# Patient Record
Sex: Male | Born: 1985 | Race: White | Hispanic: No | Marital: Single | Smoking: Former smoker
Health system: Southern US, Community
[De-identification: ages and names within clinical notes are randomized; demographics above are authoritative.]

## PROBLEM LIST (undated history)

## (undated) DIAGNOSIS — F419 Anxiety disorder, unspecified: Secondary | ICD-10-CM

## (undated) HISTORY — DX: Anxiety disorder, unspecified: F41.9

## (undated) HISTORY — PX: OTHER SURGICAL HISTORY: SHX169

---

## 2016-02-18 DIAGNOSIS — Z0000 Encounter for general adult medical examination without abnormal findings: Secondary | ICD-10-CM | POA: Diagnosis not present

## 2016-02-18 DIAGNOSIS — F4010 Social phobia, unspecified: Secondary | ICD-10-CM | POA: Diagnosis not present

## 2016-12-07 DIAGNOSIS — F4010 Social phobia, unspecified: Secondary | ICD-10-CM | POA: Diagnosis not present

## 2016-12-19 DIAGNOSIS — B078 Other viral warts: Secondary | ICD-10-CM | POA: Diagnosis not present

## 2017-02-28 ENCOUNTER — Other Ambulatory Visit: Payer: Self-pay | Admitting: Family Medicine

## 2017-02-28 ENCOUNTER — Ambulatory Visit
Admission: RE | Admit: 2017-02-28 | Discharge: 2017-02-28 | Disposition: A | Discharge status: Home / Self Care | Payer: BLUE CROSS/BLUE SHIELD | Source: Ambulatory Visit | Attending: Family Medicine | Admitting: Family Medicine

## 2017-02-28 DIAGNOSIS — F4010 Social phobia, unspecified: Secondary | ICD-10-CM | POA: Diagnosis not present

## 2017-02-28 DIAGNOSIS — R1031 Right lower quadrant pain: Secondary | ICD-10-CM

## 2017-02-28 IMAGING — CT CT ABD-PELV W/ CM
1 of 2 series · 15 of 32 positions shown, 19 images · IV contrast (APPLIED)
Comparison: None.

CLINICAL DATA: Right lower quadrant pain for 3 days, constipation

EXAM:
CT ABDOMEN AND PELVIS WITH CONTRAST
TECHNIQUE: Multidetector CT imaging of the abdomen and pelvis was performed
using the standard protocol following bolus administration of
intravenous contrast.
CONTRAST:  100mL DNLLCE-ODD IOPAMIDOL (DNLLCE-ODD) INJECTION 61%

[Series 2: abd/pelvis w/cm · axial · 0.75mm/px · z∈[+647,+1072]mm · 15 of 93 slices shown, 19 images]
[im 4/93  soft-tissue]
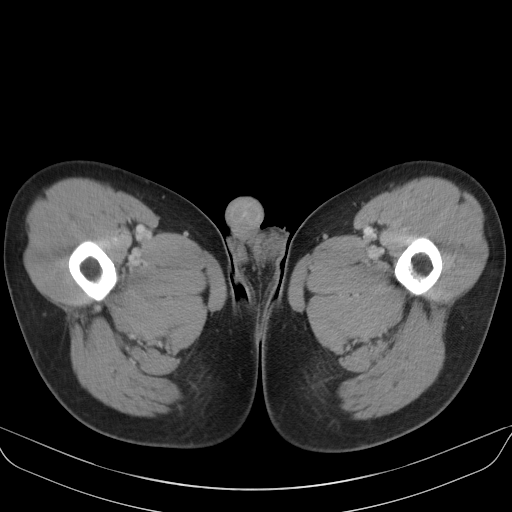
[im 4/93  bone]
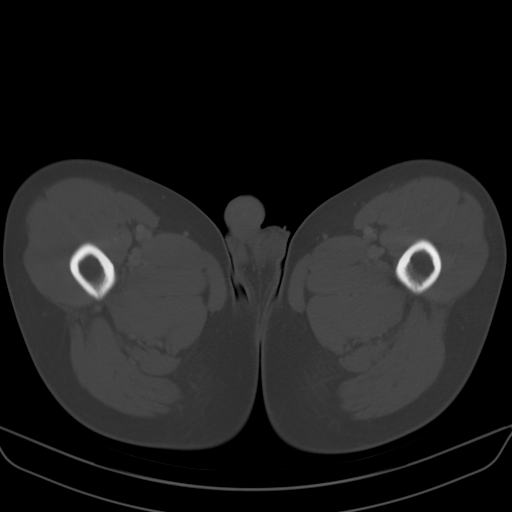
[im 11/93  soft-tissue]
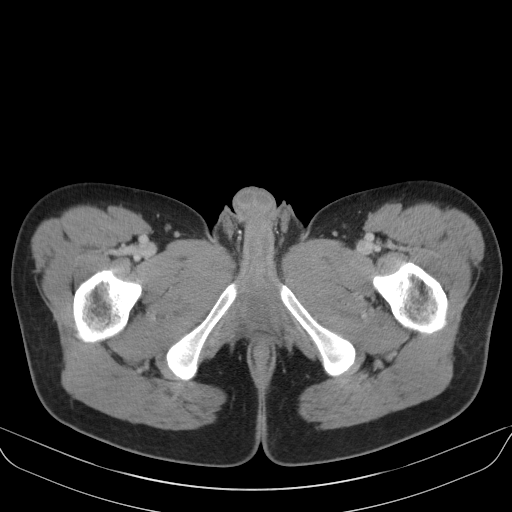
[im 18/93  soft-tissue]
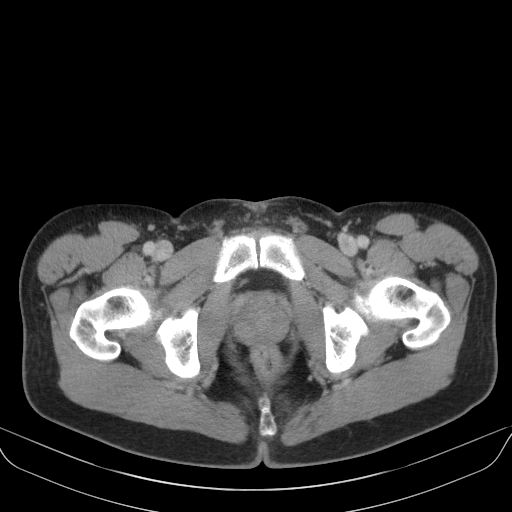
[im 25/93  soft-tissue]
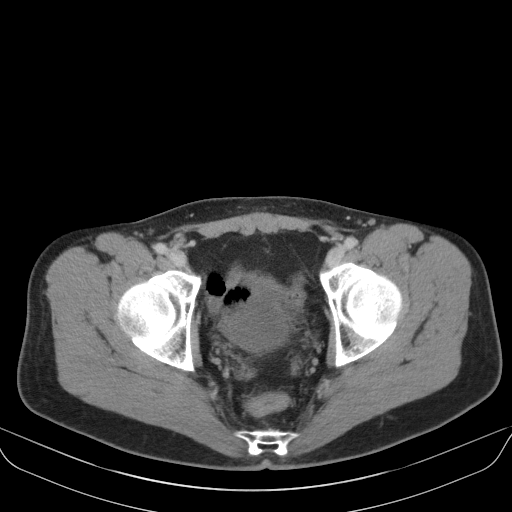
[im 32/93  soft-tissue]
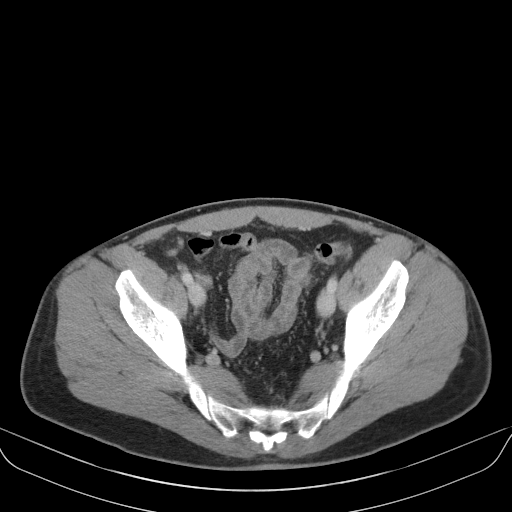
[im 39/93  soft-tissue]
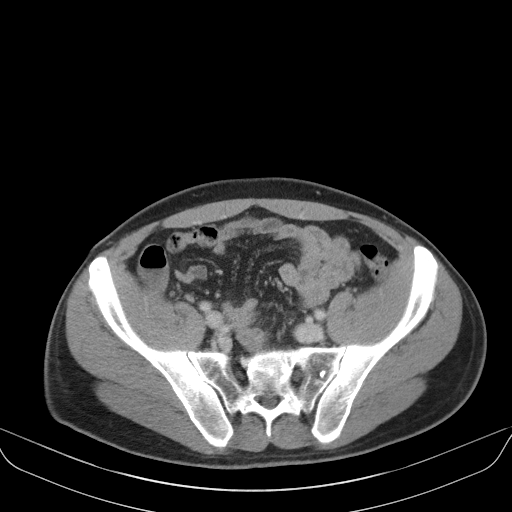
[im 47/93  soft-tissue]
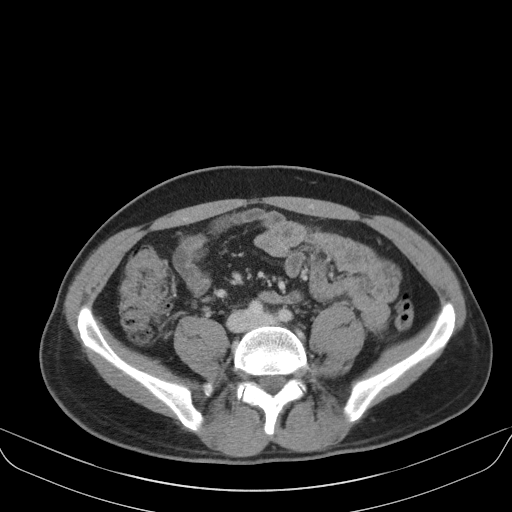
[im 54/93  soft-tissue]
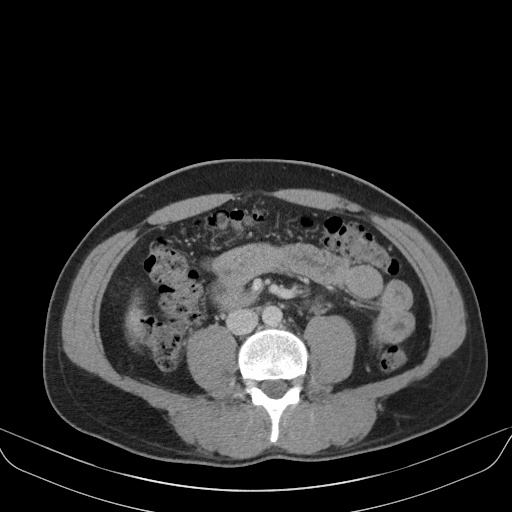
[im 61/93  soft-tissue]
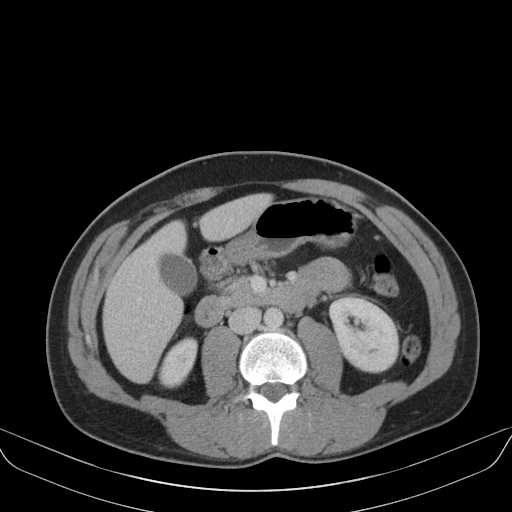
[im 61/93  bone]
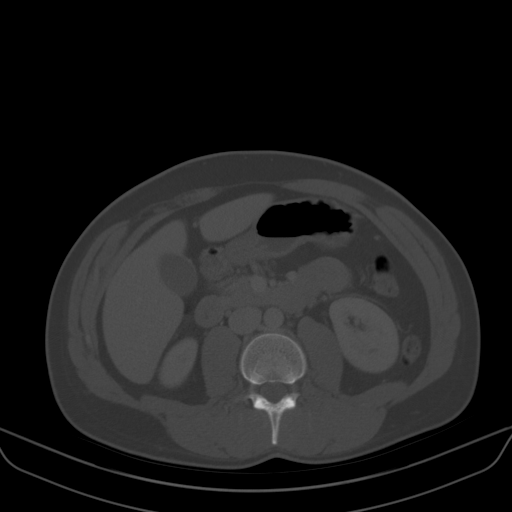
[im 68/93  soft-tissue]
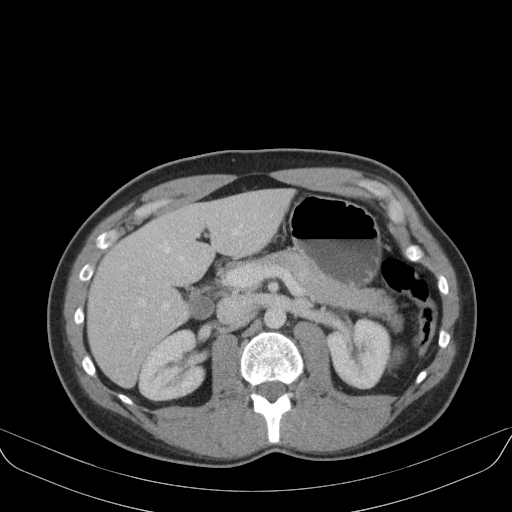
[im 75/93  soft-tissue]
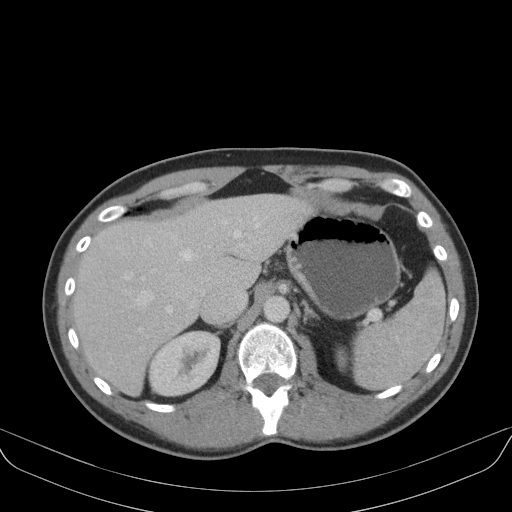
[im 78/93  lung]
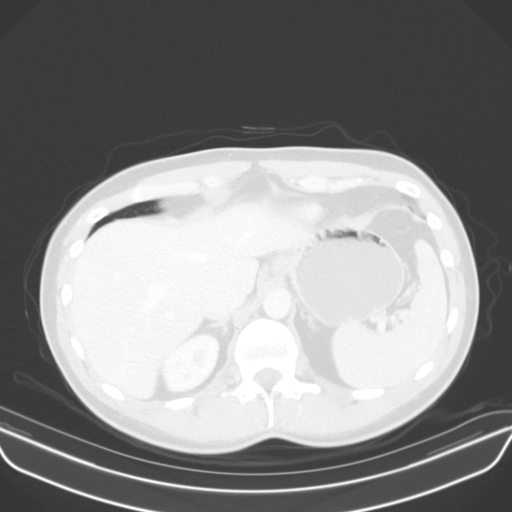
[im 82/93  soft-tissue]
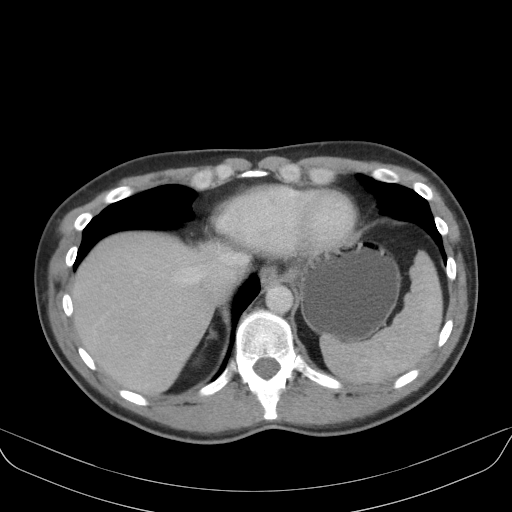
[im 82/93  lung]
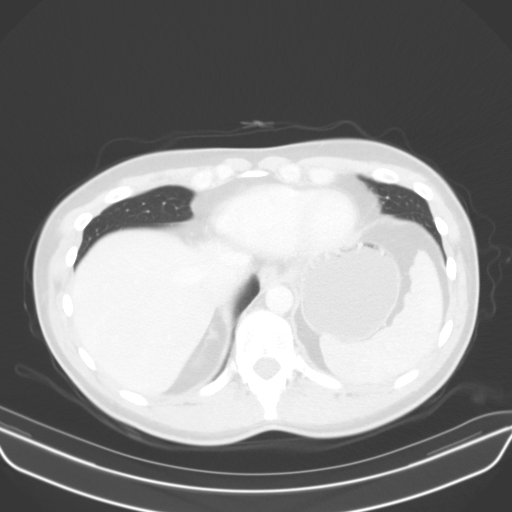
[im 85/93  lung]
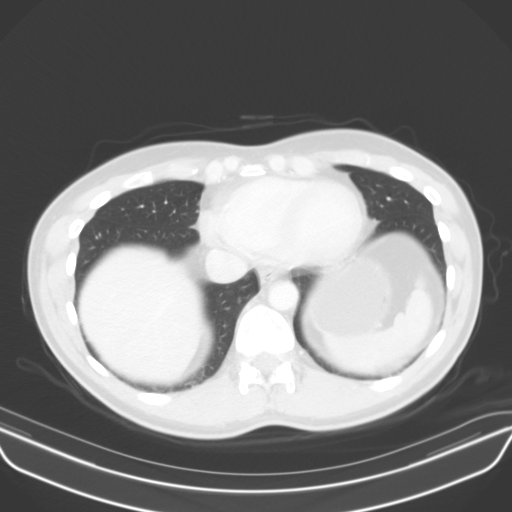
[im 89/93  soft-tissue]
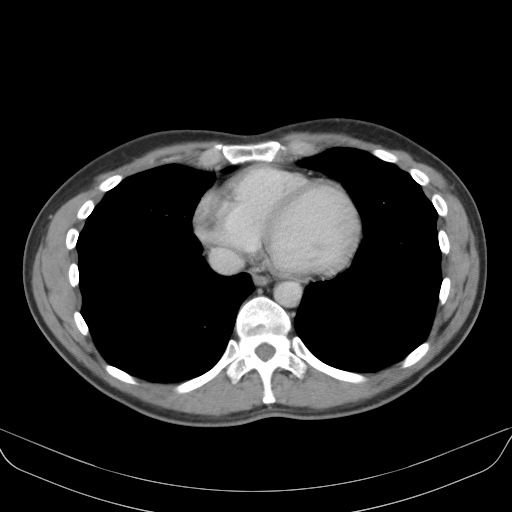
[im 89/93  lung]
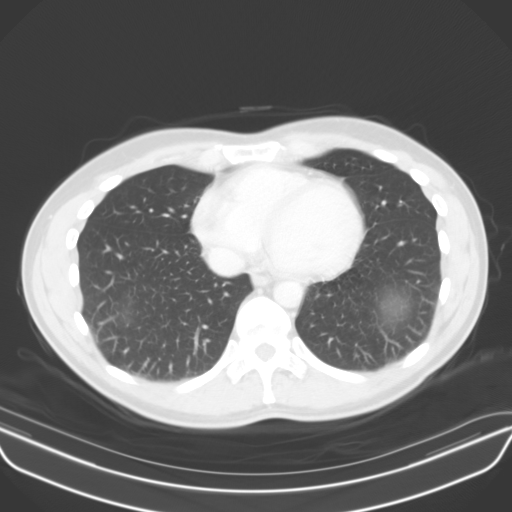

[15 of 32 positions shown; findings below may reference images not displayed]

FINDINGS: Lower chest: The lung bases are clear. The heart is within normal
limits in size.

Hepatobiliary: The liver enhances with no focal abnormality and no
ductal dilatation is seen. No calcified gallstones are noted.

Pancreas: The pancreas is normal in size in the pancreatic duct is
not dilated.

Spleen: The spleen is within normal limits in size.

Adrenals/Urinary Tract: The adrenal glands are symmetrical and
normal. The kidneys enhance with no calculus or mass. No
hydronephrosis is seen. The ureters appear normal in caliber. The
urinary bladder is not well distended but no abnormality is noted.

Stomach/Bowel: The stomach is moderately distended with fluid. No
small bowel distention is seen. The colon is largely decompressed.
There is some feces throughout the colon. The terminal ileum appears
normal, and the appendix extends caudally and anteriorly with no
evidence of inflammation noted.

Vascular/Lymphatic: The abdominal aorta is normal in caliber. No
adenopathy is seen.

Reproductive: The prostate gland is normal in size.

Other: None.

Musculoskeletal: The lumbar vertebrae are in normal alignment with
normal intervertebral disc spaces.
IMPRESSION: 1. No explanation for the patient's right lower quadrant pain is
seen.
2. The appendix and terminal ileum appear unremarkable pre
3. No renal or ureteral calculi are seen.

## 2017-02-28 MED ORDER — IOPAMIDOL (ISOVUE-300) INJECTION 61%
100.0000 mL | Freq: Once | INTRAVENOUS | Status: AC | PRN
  Administered 2017-02-28: 100 mL via INTRAVENOUS

## 2017-04-27 DIAGNOSIS — R152 Fecal urgency: Secondary | ICD-10-CM | POA: Diagnosis not present

## 2017-04-27 DIAGNOSIS — F4010 Social phobia, unspecified: Secondary | ICD-10-CM | POA: Diagnosis not present

## 2017-07-07 ENCOUNTER — Encounter: Payer: Self-pay | Admitting: Gastroenterology

## 2017-07-20 ENCOUNTER — Ambulatory Visit: Payer: BLUE CROSS/BLUE SHIELD | Admitting: Gastroenterology

## 2017-07-20 ENCOUNTER — Encounter: Payer: Self-pay | Admitting: Gastroenterology

## 2017-07-20 ENCOUNTER — Other Ambulatory Visit (INDEPENDENT_AMBULATORY_CARE_PROVIDER_SITE_OTHER): Payer: BLUE CROSS/BLUE SHIELD

## 2017-07-20 ENCOUNTER — Other Ambulatory Visit: Payer: Self-pay

## 2017-07-20 VITALS — BP 108/62 | HR 80 | Ht 71.0 in | Wt 169.2 lb

## 2017-07-20 DIAGNOSIS — R140 Abdominal distension (gaseous): Secondary | ICD-10-CM

## 2017-07-20 DIAGNOSIS — R197 Diarrhea, unspecified: Secondary | ICD-10-CM

## 2017-07-20 LAB — COMPREHENSIVE METABOLIC PANEL
ALBUMIN: 4.3 g/dL (ref 3.5–5.2)
ALK PHOS: 38 U/L — AB (ref 39–117)
ALT: 10 U/L (ref 0–53)
AST: 11 U/L (ref 0–37)
BILIRUBIN TOTAL: 0.5 mg/dL (ref 0.2–1.2)
BUN: 12 mg/dL (ref 6–23)
CALCIUM: 9.5 mg/dL (ref 8.4–10.5)
CO2: 30 mEq/L (ref 19–32)
Chloride: 103 mEq/L (ref 96–112)
Creatinine, Ser: 0.89 mg/dL (ref 0.40–1.50)
GFR: 104.89 mL/min (ref >60.00)
Glucose, Bld: 107 mg/dL — ABNORMAL HIGH (ref 70–99)
Potassium: 4.8 mEq/L (ref 3.5–5.1)
Sodium: 139 mEq/L (ref 135–145)
Total Protein: 7.3 g/dL (ref 6.0–8.3)

## 2017-07-20 LAB — CBC WITH DIFFERENTIAL/PLATELET
BASOS ABS: 0 10*3/uL (ref 0.0–0.1)
Basophils Relative: 0.9 % (ref 0.0–3.0)
EOS ABS: 0.1 10*3/uL (ref 0.0–0.7)
Eosinophils Relative: 2.2 % (ref 0.0–5.0)
HEMATOCRIT: 45.7 % (ref 39.0–52.0)
HEMOGLOBIN: 15.1 g/dL (ref 13.0–17.0)
LYMPHS PCT: 43.2 % (ref 12.0–46.0)
Lymphs Abs: 1.3 10*3/uL (ref 0.7–4.0)
MCHC: 33.1 g/dL (ref 30.0–36.0)
MCV: 80.7 fl (ref 78.0–100.0)
MONOS PCT: 11.8 % (ref 3.0–12.0)
Monocytes Absolute: 0.4 10*3/uL (ref 0.1–1.0)
Neutro Abs: 1.3 10*3/uL — ABNORMAL LOW (ref 1.4–7.7)
Neutrophils Relative %: 41.9 % — ABNORMAL LOW (ref 43.0–77.0)
Platelets: 217 10*3/uL (ref 150.0–400.0)
RBC: 5.66 Mil/uL (ref 4.22–5.81)
RDW: 13.4 % (ref 11.5–15.5)
WBC: 3 10*3/uL — AB (ref 4.0–10.5)

## 2017-07-20 LAB — IGA: IgA: 271 mg/dL (ref 68–378)

## 2017-07-20 LAB — TSH: TSH: 1.49 u[IU]/mL (ref 0.35–4.50)

## 2017-07-20 MED ORDER — LOPERAMIDE HCL 2 MG PO TABS: 2.0000 mg | ORAL_TABLET | Freq: Every day | ORAL | 0 refills | Status: DC

## 2017-07-20 MED ORDER — LOPERAMIDE HCL 2 MG PO TABS: 1.0000 mg | ORAL_TABLET | Freq: Every day | ORAL | 0 refills | Status: AC

## 2017-07-20 NOTE — Patient Instructions (Addendum)
If you are age 33 or older, your body mass index should be between 23-30. Your Body mass index is 23.6 kg/m. If this is out of the aforementioned range listed, please consider follow up with your Primary Care Provider.  If you are age 33 or younger, your body mass index should be between 19-25. Your Body mass index is 23.6 kg/m. If this is out of the aformentioned range listed, please consider follow up with your Primary Care Provider.   Please go to the lab in the basement of our building to have lab work done as you leave today.  We are giving you a Low FOD-MAP diet to follow.  Please purchase the following medications over the counter and take as directed: Imodium - Take 1/2 to 1 tablet every morning.  You can increase to twice a day as needed.   Thank you for entrusting me with your care and for choosing [facility], Dr. Ileene PatrickSteven Armbruster

## 2017-07-20 NOTE — Progress Notes (Signed)
HPI :  32 year old male with a history of anxiety, otherwise healthy, here for new patient visit to discuss changes in his bowel habits.  Over the past year he endorses the urgency to have a bowel movement after each meal. Having 2-3 BMs / day/. Stools are usually soft or loose.  He denies any blood in the stools.  He denies any abdominal pains.  He denies seeing any oil or grease in his stools.  He denies any nocturnal symptoms.  He states he has gained about 15 pounds over the past 2 years, no weight loss.  He denies any significant changes in his diet at all.  He does endorse some bloating and gas which bothers him periodically.  His grandfather had colon cancer, no other family history of colon cancer.  He denies any history of Crohn's disease or colitis or celiac disease within his family.  He previously was on Zoloft for a few years, he stopped this about 8 months ago due to symptoms which did not help at all.  He was then placed on Celexa 20 mg once a day, his symptoms have persisted to the same extent on this regimen.  He is never had a prior colonoscopy.  He did have a CT scan of his abdomen done for an episode of right lower quadrant pain in November 2018, this exam is normal without any concerning pathology.  CT scan abdomen 02/28/2017 - normal exam   Past Medical History:  Diagnosis Date  . Anxiety      Past Surgical History:  Procedure Laterality Date  . none     Family History  Problem Relation Age of Onset  . Prostate cancer Father   . Colon cancer Paternal Grandfather 53  . Esophageal cancer Neg Hx   . Stomach cancer Neg Hx   . Pancreatic cancer Neg Hx   . Liver disease Neg Hx    Social History   Tobacco Use  . Smoking status: Former Games developer  . Smokeless tobacco: Never Used  Substance Use Topics  . Alcohol use: Yes    Comment: 1 drink dialy  . Drug use: Never   Current Outpatient Medications  Medication Sig Dispense Refill  . citalopram (CELEXA) 20 MG tablet  Take 20 mg by mouth daily.     No current facility-administered medications for this visit.    No Known Allergies   Review of Systems: All systems reviewed and negative except where noted in HPI.   Lab Results  Component Value Date   WBC 3.0 (L) 07/20/2017   HGB 15.1 07/20/2017   HCT 45.7 07/20/2017   MCV 80.7 07/20/2017   PLT 217.0 07/20/2017    Lab Results  Component Value Date   CREATININE 0.89 07/20/2017   BUN 12 07/20/2017   NA 139 07/20/2017   K 4.8 07/20/2017   CL 103 07/20/2017   CO2 30 07/20/2017    Lab Results  Component Value Date   ALT 10 07/20/2017   AST 11 07/20/2017   ALKPHOS 38 (L) 07/20/2017   BILITOT 0.5 07/20/2017     Physical Exam: BP 108/62   Pulse 80   Ht 5\' 11"  (1.803 m)   Wt 169 lb 3.2 oz (76.7 kg)   BMI 23.60 kg/m  Constitutional: Pleasant,well-developed, male in no acute distress. HEENT: Normocephalic and atraumatic. Conjunctivae are normal. No scleral icterus. Neck supple.  Cardiovascular: Normal rate, regular rhythm.  Pulmonary/chest: Effort normal and breath sounds normal. No wheezing, rales or rhonchi. Abdominal:  Soft, nondistended, nontender.  There are no masses palpable. No hepatomegaly. Extremities: no edema Lymphadenopathy: No cervical adenopathy noted. Neurological: Alert and oriented to person place and time. Skin: Skin is warm and dry. No rashes noted. Psychiatric: Normal mood and affect. Behavior is normal.   ASSESSMENT AND PLAN: 32 year old male presenting with altered bowel habits / urgency of stools with meals over the past year, as well as bloating.  No other alarm symptoms.  He denies any significant changes in his diet to be related to this.  Trial of coming off Zoloft did not provide any benefit.  We discussed potential causes.  Sent basic labs to ensure, no anemia, etc.  We will also check thyroid and celiac serologies.  In light of some of his bloating recommend a trial of a low FODMAP diet.  Otherwise  recommend trial of Imodium once in the morning and can titrate up or down as needed and see if that helps.  We will relay results of lab work with him when available.  I asked him to touch base with our office in a few weeks and let me know how he is doing on this regimen.   Ileene PatrickSteven Mahlani Berninger, MD [facility] Pager [phone]  CC: Juluis RainierBarnes, Elizabeth, MD

## 2017-07-20 NOTE — Progress Notes (Signed)
Update pt's chart for Imodium dosage

## 2017-07-24 LAB — TISSUE TRANSGLUTAMINASE ABS,IGG,IGA
(TTG) AB, IGG: 2 U/mL
(tTG) Ab, IgA: 1 U/mL

## 2017-07-26 ENCOUNTER — Telehealth: Payer: Self-pay | Admitting: Gastroenterology

## 2017-07-26 NOTE — Telephone Encounter (Signed)
See result note.  

## 2017-07-26 NOTE — Telephone Encounter (Signed)
Patient returning call from Jan.  °

## 2018-08-14 DIAGNOSIS — F4010 Social phobia, unspecified: Secondary | ICD-10-CM | POA: Diagnosis not present

## 2019-12-12 DIAGNOSIS — F4010 Social phobia, unspecified: Secondary | ICD-10-CM | POA: Diagnosis not present

## 2023-03-17 ENCOUNTER — Other Ambulatory Visit: Payer: Self-pay

## 2023-03-17 ENCOUNTER — Encounter: Payer: Self-pay | Admitting: Podiatry

## 2023-03-17 ENCOUNTER — Ambulatory Visit: Payer: 59 | Admitting: Podiatry

## 2023-03-17 ENCOUNTER — Ambulatory Visit: Payer: 59

## 2023-03-17 DIAGNOSIS — M7731 Calcaneal spur, right foot: Secondary | ICD-10-CM

## 2023-03-17 DIAGNOSIS — M79672 Pain in left foot: Secondary | ICD-10-CM

## 2023-03-17 DIAGNOSIS — M79671 Pain in right foot: Secondary | ICD-10-CM

## 2023-03-17 DIAGNOSIS — M722 Plantar fascial fibromatosis: Secondary | ICD-10-CM

## 2023-03-17 MED ORDER — TRIAMCINOLONE ACETONIDE 10 MG/ML IJ SUSP: 2.5000 mg | Freq: Once | INTRAMUSCULAR | Status: AC

## 2023-03-17 MED ORDER — DEXAMETHASONE SODIUM PHOSPHATE 120 MG/30ML IJ SOLN: 4.0000 mg | Freq: Once | INTRAMUSCULAR | Status: AC

## 2023-03-17 MED ORDER — MELOXICAM 15 MG PO TABS: 15.0000 mg | ORAL_TABLET | Freq: Every day | ORAL | 0 refills | Status: DC

## 2023-03-17 NOTE — Patient Instructions (Signed)

## 2023-03-17 NOTE — Progress Notes (Signed)
  Subjective:  Patient ID: Shawn Diaz, male    DOB: 1985-08-27,   MRN: [id number]  Chief Complaint  Patient presents with   Foot Pain    Pt presents for bil foot that started about a year ago pt states the pain is more in the heel and the arch worse when he is on the feet for a long period of time.    37 y.o. male presents for concern as above. Relates he gets most of his pain in the bottom of the heel and arch. More activity hurt worse but does get pain first steps in the morning. He has tried stretching and a night splint without much relief. Has tried some aleve as well.  . Denies any other pedal complaints. Denies n/v/f/c.   Past Medical History:  Diagnosis Date   Anxiety     Objective:  Physical Exam: Vascular: DP/PT pulses 2/4 bilateral. CFT <3 seconds. Normal hair growth on digits. No edema.  Skin. No lacerations or abrasions bilateral feet.  Musculoskeletal: MMT 5/5 bilateral lower extremities in DF, PF, Inversion and Eversion. Deceased ROM in DF of ankle joint. Tender to the medial calcaneal tubercle bilaterally . No pain with achilles, PT or arch. No pain with calcaneal squeeze.  Neurological: Sensation intact to light touch.   Assessment:   1. Plantar fasciitis, bilateral      Plan:  Patient was evaluated and treated and all questions answered. Discussed plantar fasciitis with patient.  X-rays reviewed and discussed with patient. No acute fractures or dislocations noted. Mild spurring noted at inferior calcaneus.  Discussed treatment options including, ice, NSAIDS, supportive shoes, bracing, and stretching. Stretching exercises provided to be done on a daily basis.   Prescription for meloxicam provided and sent to pharmacy. Patient requesting injection today. Procedure note below.   PF brace dispensed bilatearl.  Follow-up 6 weeks or sooner if any problems arise. In the meantime, encouraged to call the office with any questions, concerns, change in symptoms.    Procedure:  Discussed etiology, pathology, conservative vs. surgical therapies. At this time a plantar fascial injection was recommended.  The patient agreed and a sterile skin prep was applied.  An injection consisting of  1cc dexamethasone 0.5 cc kenalog and 1cc marcaine mixture was infiltrated at the point of maximal tenderness on the bilateral Heel.  Bandaid applied. The patient tolerated this well and was given instructions for aftercare.    Louann Sjogren, DPM

## 2023-04-12 ENCOUNTER — Other Ambulatory Visit: Payer: Self-pay | Admitting: Podiatry

## 2023-12-19 ENCOUNTER — Ambulatory Visit: Admitting: Podiatry

## 2023-12-20 ENCOUNTER — Ambulatory Visit: Admitting: Podiatry

## 2023-12-20 ENCOUNTER — Encounter: Payer: Self-pay | Admitting: Podiatry

## 2023-12-20 DIAGNOSIS — M722 Plantar fascial fibromatosis: Secondary | ICD-10-CM | POA: Diagnosis not present

## 2023-12-20 MED ORDER — MELOXICAM 15 MG PO TABS: 15.0000 mg | ORAL_TABLET | Freq: Every day | ORAL | 0 refills | Status: AC

## 2023-12-20 NOTE — Progress Notes (Signed)
  Subjective:  Patient ID: Shawn Diaz, male    DOB: [birth year],   MRN: [id number]  Chief Complaint  Patient presents with   Plantar Fasciitis    The Plantar Fasciitis is not going away.  I been dealing with it for about a year now.  It's both feet.    38 y.o. male presents for concern of continuing plantar fasciitis. Was lost to follow-up for about a year. Relates the pain never went away and has been dealing with it.  Relates still first steps in the morning and now also getting pain under his fifth metatarsal head on the left  . Denies any other pedal complaints. Denies n/v/f/c.   Past Medical History:  Diagnosis Date   Anxiety     Objective:  Physical Exam: Vascular: DP/PT pulses 2/4 bilateral. CFT <3 seconds. Normal hair growth on digits. No edema.  Skin. No lacerations or abrasions bilateral feet.  Musculoskeletal: MMT 5/5 bilateral lower extremities in DF, PF, Inversion and Eversion. Deceased ROM in DF of ankle joint. Tender to the medial calcaneal tubercle bilaterally . No pain with achilles, PT or arch. No pain with calcaneal squeeze. Some pain over the plantar fifth metatarsal on the left.  Neurological: Sensation intact to light touch.   Assessment:   1. Plantar fasciitis, bilateral       Plan:  Patient was evaluated and treated and all questions answered. Discussed plantar fasciitis with patient.  X-rays reviewed and discussed with patient. No acute fractures or dislocations noted. Mild spurring noted at inferior calcaneus.  Discussed treatment options including, ice, NSAIDS, supportive shoes, bracing, and stretching. Stretching exercises provided to be done on a daily basis.   Continue anti-inflammatories  Refill of meloxicam .  Amb ref to PT   Continue brace.  Follow-up 8 weeks or sooner if any problems arise. In the meantime, encouraged to call the office with any questions, concerns, change in symptoms.      Asberry Failing, DPM

## 2024-01-09 ENCOUNTER — Other Ambulatory Visit: Payer: Self-pay

## 2024-01-09 ENCOUNTER — Ambulatory Visit: Attending: Podiatry

## 2024-01-09 DIAGNOSIS — M79671 Pain in right foot: Secondary | ICD-10-CM | POA: Insufficient documentation

## 2024-01-09 DIAGNOSIS — R2689 Other abnormalities of gait and mobility: Secondary | ICD-10-CM | POA: Insufficient documentation

## 2024-01-09 DIAGNOSIS — M79672 Pain in left foot: Secondary | ICD-10-CM | POA: Insufficient documentation

## 2024-01-09 DIAGNOSIS — M722 Plantar fascial fibromatosis: Secondary | ICD-10-CM | POA: Diagnosis not present

## 2024-01-09 NOTE — Therapy (Signed)
 OUTPATIENT PHYSICAL THERAPY LOWER EXTREMITY EVALUATION   Patient Name: Shawn Diaz MRN: [id number] DOB:1986/04/13, 38 y.o., male Today's Date: 01/09/2024  END OF SESSION:  PT End of Session - 01/09/24 0836     Visit Number 1    Number of Visits 9    Date for PT Re-Evaluation 03/05/24    Authorization Type [employer]    PT Start Time 0801    PT Stop Time 0832    PT Time Calculation (min) 31 min    Activity Tolerance Patient tolerated treatment well    Behavior During Therapy [facility] for tasks assessed/performed          Past Medical History:  Diagnosis Date   Anxiety    Past Surgical History:  Procedure Laterality Date   none     There are no active problems to display for this patient.   PCP: Cleotilde Planas, MD  REFERRING PROVIDER: Joya Stabs, DPM   REFERRING DIAG: M72.2 (ICD-10-CM) - Plantar fasciitis, bilateral   THERAPY DIAG:  Pain in left foot  Pain in right foot  Other abnormalities of gait and mobility  Rationale for Evaluation and Treatment: Rehabilitation  ONSET DATE: Chronic  SUBJECTIVE:   SUBJECTIVE STATEMENT: Pt presents to PT with reports of chronic bilateral plantar fascia pain for over one year. Notes pain is worst with first step in morning. Pt notes calf stretching does seem to help. Not limited in any activity but wants to be more comfort with activity.   PERTINENT HISTORY: See PMH  PAIN:  Are you having pain?  Yes: NPRS scale: 3/10 Worst: 7/10 Pain location: bilateral plantar fascia L>R Pain description: sharp, tight Aggravating factors: first step in morning Relieving factors: shoes, arch support  PRECAUTIONS: None  RED FLAGS: None   WEIGHT BEARING RESTRICTIONS: No  FALLS:  Has patient fallen in last 6 months? No  LIVING ENVIRONMENT: Lives with: lives with their family Lives in: House/apartment  OCCUPATION: Works at [employer]   PLOF: Independent  PATIENT GOALS: decrease foot pain with walking, daily activities  NEXT  MD VISIT: 02/21/2024  OBJECTIVE:  Note: Objective measures were completed at Evaluation unless otherwise noted.  DIAGNOSTIC FINDINGS: N/A  PATIENT SURVEYS:  LEFS  Extreme difficulty/unable (0), Quite a bit of difficulty (1), Moderate difficulty (2), Little difficulty (3), No difficulty (4) Survey date:  01/09/24  Any of your usual work, housework or school activities 3  2. Usual hobbies, recreational or sporting activities 3  3. Getting into/out of the bath 3  4. Walking between rooms 3  5. Putting on socks/shoes 3  6. Squatting  3  7. Lifting an object, like a bag of groceries from the floor 3  8. Performing light activities around your home 3  9. Performing heavy activities around your home 3  10. Getting into/out of a car 3  11. Walking 2 blocks 3  12. Walking 1 mile 3  13. Going up/down 10 stairs (1 flight) 3  14. Standing for 1 hour 3  15.  sitting for 1 hour 3  16. Running on even ground 2  17. Running on uneven ground 2  18. Making sharp turns while running fast 2  19. Hopping  3  20. Rolling over in bed 4  Score total:  68/80     COGNITION: Overall cognitive status: Within functional limits for tasks assessed     SENSATION: WFL  POSTURE: No Significant postural limitations  PALPATION: Slight TTP to bilateral gastroc/soleus   LOWER EXTREMITY  ROM:  Active ROM Right eval Left eval  Hip flexion    Hip extension    Hip abduction    Hip adduction    Hip internal rotation    Hip external rotation    Knee flexion    Knee extension    Ankle dorsiflexion 10 12  Ankle plantarflexion    Ankle inversion    Ankle eversion     (Blank rows = not tested)  LOWER EXTREMITY MMT:  MMT Right eval Left eval  Hip flexion    Hip extension    Hip abduction    Hip adduction    Hip internal rotation    Hip external rotation    Knee flexion    Knee extension    Ankle dorsiflexion    Ankle plantarflexion    Ankle inversion    Ankle eversion     (Blank rows =  not tested)  LOWER EXTREMITY SPECIAL TESTS:  DNT  FUNCTIONAL TESTS:  Functional squat - able with hands to floor  GAIT: Distance walked: 28ft Assistive device utilized: None Level of assistance: Complete Independence Comments: slight antalgic gait L   TREATMENT: [facility] Adult PT Treatment:                                                DATE: 01/09/2024 Therapeutic Exercise: Towel scrunch x 60 Ankle inv/ev x 10 RTB Gastroc stretch x 30 L Soleus stretch x 30 L Eccentric lowering x 5 - 4in step  PATIENT EDUCATION:  Education details: eval findings, LEFS, HEP, POC Person educated: Patient Education method: Explanation, Demonstration, and Handouts Education comprehension: verbalized understanding and returned demonstration  HOME EXERCISE PROGRAM: Access Code: [email or web address] URL: [email or web address] Date: 01/09/2024 Prepared by: Alm Kingdom  Exercises - Towel Scrunches  - 2 x daily - 7 x weekly - 3 sets - 1-2 min hold - Ankle Inversion with Resistance  - 2 x daily - 7 x weekly - 3 sets - 10 reps - red band hold - Ankle Eversion with Resistance  - 2 x daily - 7 x weekly - 3 sets - 10 reps - red band hold - Gastroc Stretch on Wall  - 2 x daily - 7 x weekly - 2-3 reps - 30 sec hold - Soleus Stretch on Wall  - 2 x daily - 7 x weekly - 3 sets - 2-3 reps - 30 sec hold - Eccentric Heel Lowering on Step  - 1 x daily - 7 x weekly - 2-3 sets - 10 reps  ASSESSMENT:  CLINICAL IMPRESSION: Patient is a 38 y.o. M who was seen today for physical therapy evaluation and treatment for chronic bilateral plantar fasciitis. Physical findings are consistent with DPM impression as pt demonstrates decrease in soleus length and palpable plantar fascia pain. LEFS shows decrease in functional ability below PLOF. Pt would benefit from skilled PT services working on foot intrinsic strength and calf length in order to decrease pain.   OBJECTIVE IMPAIRMENTS: Abnormal gait, decreased activity  tolerance, decreased mobility, difficulty walking, decreased ROM, decreased strength, and pain  ACTIVITY LIMITATIONS: carrying, lifting, standing, squatting, stairs, transfers, and locomotion level  PARTICIPATION LIMITATIONS: driving, shopping, community activity, occupation, and yard work  PERSONAL FACTORS: Time since onset of injury/illness/exacerbation are also affecting patient's functional outcome.   REHAB POTENTIAL: Excellent  CLINICAL DECISION MAKING: Stable/uncomplicated  EVALUATION COMPLEXITY: Low  GOALS: Goals reviewed with patient? No  SHORT TERM GOALS: Target date: 01/30/2024   Pt will be compliant and knowledgeable with initial HEP for improved comfort and carryover Baseline: initial HEP given  Goal status: INITIAL  2.  Pt will self report bilateral foot pain no greater than 4/10 for improved comfort and functional ability Baseline: 7/10 at worst Goal status: INITIAL   LONG TERM GOALS: Target date: 03/05/2024   Pt will improve LEFS to no less than 78/80 as proxy for functional improvement with home ADLs and higher level community activity Baseline: 68/80 Goal status: INITIAL   2.  Pt will self report bilateral foot pain no greater than 1-2/10 for improved comfort and functional ability Baseline: 7/10 at worst Goal status: INITIAL   3.  Pt will improve bilateral ankle DF to no less than 15 degrees for improved calf length and decreased pain Baseline: see chart  Goal status: INITIAL  4.  Pt will be able to perform walking and desired recreational activities with no increase in foot pain for improved comfort and function Baseline: unable Goal status: INITIAL   PLAN:  PT FREQUENCY: 1x/week  PT DURATION: 8 weeks  PLANNED INTERVENTIONS: 97164- PT Re-evaluation, 97110-Therapeutic exercises, 97530- Therapeutic activity, [id number]- Neuromuscular re-education, 97535- Self Care, [id number]- Manual therapy, [id number]- Gait training, 97016- Vasopneumatic device, [phone] (1-2  muscles), [id number] (3+ muscles)- Dry Needling, Patient/Family education, Cryotherapy, and Moist heat  PLAN FOR NEXT SESSION: assess HEP response, calf stretching/eccentric strengthening, foot intrinsics   Alm JAYSON Kingdom, PT 01/09/2024, 8:38 AM

## 2024-01-22 ENCOUNTER — Ambulatory Visit: Admitting: Physical Therapy

## 2024-01-22 ENCOUNTER — Encounter: Payer: Self-pay | Admitting: Physical Therapy

## 2024-01-22 DIAGNOSIS — R2689 Other abnormalities of gait and mobility: Secondary | ICD-10-CM

## 2024-01-22 DIAGNOSIS — M79671 Pain in right foot: Secondary | ICD-10-CM

## 2024-01-22 DIAGNOSIS — M79672 Pain in left foot: Secondary | ICD-10-CM

## 2024-01-22 NOTE — Therapy (Signed)
 OUTPATIENT PHYSICAL THERAPY LOWER EXTREMITY TREATMENT    Patient Name: Shawn Diaz MRN: [id number] DOB:Apr 01, 1986, 38 y.o., male Today's Date: 01/22/2024  END OF SESSION:  PT End of Session - 01/22/24 0805     Visit Number 2    Number of Visits 9    Date for Recertification  03/05/24    Authorization Type [employer]    PT Start Time 0805    PT Stop Time 0845    PT Time Calculation (min) 40 min          Past Medical History:  Diagnosis Date   Anxiety    Past Surgical History:  Procedure Laterality Date   none     There are no active problems to display for this patient.   PCP: Cleotilde Planas, MD  REFERRING PROVIDER: Joya Stabs, DPM   REFERRING DIAG: M72.2 (ICD-10-CM) - Plantar fasciitis, bilateral   THERAPY DIAG:  Pain in left foot  Pain in right foot  Other abnormalities of gait and mobility  Rationale for Evaluation and Treatment: Rehabilitation  ONSET DATE: Chronic  SUBJECTIVE:   SUBJECTIVE STATEMENT: Pain is 5/10 this morning. I think the exercises are helping.   EVAL: Pt presents to PT with reports of chronic bilateral plantar fascia pain for over one year. Notes pain is worst with first step in morning. Pt notes calf stretching does seem to help. Not limited in any activity but wants to be more comfort with activity.   PERTINENT HISTORY: See PMH  PAIN:  Are you having pain?  Yes: NPRS scale: 5/10 Worst: 7/10 Pain location: bilateral plantar fascia L>R Pain description: sharp, tight Aggravating factors: first step in morning Relieving factors: shoes, arch support  PRECAUTIONS: None  RED FLAGS: None   WEIGHT BEARING RESTRICTIONS: No  FALLS:  Has patient fallen in last 6 months? No  LIVING ENVIRONMENT: Lives with: lives with their family Lives in: House/apartment  OCCUPATION: Works at [employer]   PLOF: Independent  PATIENT GOALS: decrease foot pain with walking, daily activities  NEXT MD VISIT: 02/21/2024  OBJECTIVE:  Note:  Objective measures were completed at Evaluation unless otherwise noted.  DIAGNOSTIC FINDINGS: N/A  PATIENT SURVEYS:  LEFS  Extreme difficulty/unable (0), Quite a bit of difficulty (1), Moderate difficulty (2), Little difficulty (3), No difficulty (4) Survey date:  01/09/24  Any of your usual work, housework or school activities 3  2. Usual hobbies, recreational or sporting activities 3  3. Getting into/out of the bath 3  4. Walking between rooms 3  5. Putting on socks/shoes 3  6. Squatting  3  7. Lifting an object, like a bag of groceries from the floor 3  8. Performing light activities around your home 3  9. Performing heavy activities around your home 3  10. Getting into/out of a car 3  11. Walking 2 blocks 3  12. Walking 1 mile 3  13. Going up/down 10 stairs (1 flight) 3  14. Standing for 1 hour 3  15.  sitting for 1 hour 3  16. Running on even ground 2  17. Running on uneven ground 2  18. Making sharp turns while running fast 2  19. Hopping  3  20. Rolling over in bed 4  Score total:  68/80     COGNITION: Overall cognitive status: Within functional limits for tasks assessed     SENSATION: WFL  POSTURE: No Significant postural limitations  PALPATION: Slight TTP to bilateral gastroc/soleus   LOWER EXTREMITY ROM:  Active ROM  Right eval Left eval  Hip flexion    Hip extension    Hip abduction    Hip adduction    Hip internal rotation    Hip external rotation    Knee flexion    Knee extension    Ankle dorsiflexion 10 12  Ankle plantarflexion    Ankle inversion    Ankle eversion     (Blank rows = not tested)  LOWER EXTREMITY MMT:  MMT Right eval Left eval  Hip flexion    Hip extension    Hip abduction    Hip adduction    Hip internal rotation    Hip external rotation    Knee flexion    Knee extension    Ankle dorsiflexion    Ankle plantarflexion    Ankle inversion    Ankle eversion     (Blank rows = not tested)  LOWER EXTREMITY SPECIAL  TESTS:  DNT  FUNCTIONAL TESTS:  Functional squat - able with hands to floor  GAIT: Distance walked: 69ft Assistive device utilized: None Level of assistance: Complete Independence Comments: slight antalgic gait L   TREATMENT: [facility] Adult PT Treatment:                                                DATE: 01/22/24 Therapeutic Exercise: Rec Bike Level 2 x 5 minutes  Eccentric lowering 10 x 2 each  - 4in step Gastroc stretch slant board Soleus stretch slant board  SLS on oval 60 sec each  Towel scrunch x 60 Ankle inv/ev x 10 RTB Seated toe scrunches on towel  Supine ankle inversion/eversion Red band x 20 each  Long sitting plantar fascia stretch with towel  Manual Therapy: Plantar fascia STW   Self Care: Discussed rolling water bottle under foot    [facility] Adult PT Treatment:                                                DATE: 01/09/2024 Therapeutic Exercise: Towel scrunch x 60 Ankle inv/ev x 10 RTB Gastroc stretch x 30 L Soleus stretch x 30 L Eccentric lowering x 5 - 4in step  PATIENT EDUCATION:  Education details: eval findings, LEFS, HEP, POC Person educated: Patient Education method: Explanation, Demonstration, and Handouts Education comprehension: verbalized understanding and returned demonstration  HOME EXERCISE PROGRAM: Access Code: [email or web address] URL: [email or web address] Date: 01/09/2024 Prepared by: Alm Kingdom  Exercises - Towel Scrunches  - 2 x daily - 7 x weekly - 3 sets - 1-2 min hold - Ankle Inversion with Resistance  - 2 x daily - 7 x weekly - 3 sets - 10 reps - red band hold - Ankle Eversion with Resistance  - 2 x daily - 7 x weekly - 3 sets - 10 reps - red band hold - Gastroc Stretch on Wall  - 2 x daily - 7 x weekly - 2-3 reps - 30 sec hold - Soleus Stretch on Wall  - 2 x daily - 7 x weekly - 3 sets - 2-3 reps - 30 sec hold - Eccentric Heel Lowering on Step  - 1 x daily - 7 x weekly - 2-3 sets - 10 reps  ASSESSMENT:  CLINICAL  IMPRESSION: Pt reports compliance  with HEP and mild improvement. 5/10 pain in plantar heels today. Reviewed HEP, discussed self care strategies and began manual for Plantar fascia pain. No increased pain reported at end of session.    EVAL: Patient is a 39 y.o. M who was seen today for physical therapy evaluation and treatment for chronic bilateral plantar fasciitis. Physical findings are consistent with DPM impression as pt demonstrates decrease in soleus length and palpable plantar fascia pain. LEFS shows decrease in functional ability below PLOF. Pt would benefit from skilled PT services working on foot intrinsic strength and calf length in order to decrease pain.   OBJECTIVE IMPAIRMENTS: Abnormal gait, decreased activity tolerance, decreased mobility, difficulty walking, decreased ROM, decreased strength, and pain  ACTIVITY LIMITATIONS: carrying, lifting, standing, squatting, stairs, transfers, and locomotion level  PARTICIPATION LIMITATIONS: driving, shopping, community activity, occupation, and yard work  PERSONAL FACTORS: Time since onset of injury/illness/exacerbation are also affecting patient's functional outcome.   REHAB POTENTIAL: Excellent  CLINICAL DECISION MAKING: Stable/uncomplicated  EVALUATION COMPLEXITY: Low   GOALS: Goals reviewed with patient? No  SHORT TERM GOALS: Target date: 01/30/2024   Pt will be compliant and knowledgeable with initial HEP for improved comfort and carryover Baseline: initial HEP given  Goal status: INITIAL  2.  Pt will self report bilateral foot pain no greater than 4/10 for improved comfort and functional ability Baseline: 7/10 at worst Goal status: INITIAL   LONG TERM GOALS: Target date: 03/05/2024   Pt will improve LEFS to no less than 78/80 as proxy for functional improvement with home ADLs and higher level community activity Baseline: 68/80 Goal status: INITIAL   2.  Pt will self report bilateral foot pain no greater than 1-2/10  for improved comfort and functional ability Baseline: 7/10 at worst Goal status: INITIAL   3.  Pt will improve bilateral ankle DF to no less than 15 degrees for improved calf length and decreased pain Baseline: see chart  Goal status: INITIAL  4.  Pt will be able to perform walking and desired recreational activities with no increase in foot pain for improved comfort and function Baseline: unable Goal status: INITIAL   PLAN:  PT FREQUENCY: 1x/week  PT DURATION: 8 weeks  PLANNED INTERVENTIONS: 97164- PT Re-evaluation, 97110-Therapeutic exercises, 97530- Therapeutic activity, 97112- Neuromuscular re-education, 97535- Self Care, 02859- Manual therapy, [id number]- Gait training, 97016- Vasopneumatic device, [phone] (1-2 muscles), 20561 (3+ muscles)- Dry Needling, Patient/Family education, Cryotherapy, and Moist heat  PLAN FOR NEXT SESSION: assess HEP response, calf stretching/eccentric strengthening, foot intrinsics   Harlene Persons, PTA 01/22/24 9:15 AM Phone: [phone] Fax: [phone]

## 2024-01-29 ENCOUNTER — Ambulatory Visit: Attending: Podiatry | Admitting: Physical Therapy

## 2024-01-29 ENCOUNTER — Encounter: Payer: Self-pay | Admitting: Physical Therapy

## 2024-01-29 DIAGNOSIS — M79672 Pain in left foot: Secondary | ICD-10-CM | POA: Diagnosis present

## 2024-01-29 DIAGNOSIS — R2689 Other abnormalities of gait and mobility: Secondary | ICD-10-CM | POA: Diagnosis present

## 2024-01-29 DIAGNOSIS — M79671 Pain in right foot: Secondary | ICD-10-CM | POA: Diagnosis present

## 2024-01-29 NOTE — Therapy (Signed)
 OUTPATIENT PHYSICAL THERAPY LOWER EXTREMITY TREATMENT    Patient Name: Shawn Diaz MRN: [id number] DOB:1985-05-31, 38 y.o., male Today's Date: 01/29/2024  END OF SESSION:  PT End of Session - 01/29/24 0805     Visit Number 3    Number of Visits 9    Date for Recertification  03/05/24    Authorization Type [employer]    PT Start Time 0804    PT Stop Time 0842    PT Time Calculation (min) 38 min          Past Medical History:  Diagnosis Date   Anxiety    Past Surgical History:  Procedure Laterality Date   none     There are no active problems to display for this patient.   PCP: Cleotilde Planas, MD  REFERRING PROVIDER: Joya Stabs, DPM   REFERRING DIAG: M72.2 (ICD-10-CM) - Plantar fasciitis, bilateral   THERAPY DIAG:  Pain in left foot  Pain in right foot  Other abnormalities of gait and mobility  Rationale for Evaluation and Treatment: Rehabilitation  ONSET DATE: Chronic  SUBJECTIVE:   SUBJECTIVE STATEMENT: Pain is 3/10 this morning.The morning pain is better.   EVAL: Pt presents to PT with reports of chronic bilateral plantar fascia pain for over one year. Notes pain is worst with first step in morning. Pt notes calf stretching does seem to help. Not limited in any activity but wants to be more comfort with activity.   PERTINENT HISTORY: See PMH  PAIN:  Are you having pain?  Yes: NPRS scale: 3/10 Worst: 7/10 Pain location: bilateral plantar fascia L>R Pain description: sharp, tight Aggravating factors: first step in morning Relieving factors: shoes, arch support  PRECAUTIONS: None  RED FLAGS: None   WEIGHT BEARING RESTRICTIONS: No  FALLS:  Has patient fallen in last 6 months? No  LIVING ENVIRONMENT: Lives with: lives with their family Lives in: House/apartment  OCCUPATION: Works at [employer]   PLOF: Independent  PATIENT GOALS: decrease foot pain with walking, daily activities  NEXT MD VISIT: 02/21/2024  OBJECTIVE:  Note: Objective  measures were completed at Evaluation unless otherwise noted.  DIAGNOSTIC FINDINGS: N/A  PATIENT SURVEYS:  LEFS  Extreme difficulty/unable (0), Quite a bit of difficulty (1), Moderate difficulty (2), Little difficulty (3), No difficulty (4) Survey date:  01/09/24  Any of your usual work, housework or school activities 3  2. Usual hobbies, recreational or sporting activities 3  3. Getting into/out of the bath 3  4. Walking between rooms 3  5. Putting on socks/shoes 3  6. Squatting  3  7. Lifting an object, like a bag of groceries from the floor 3  8. Performing light activities around your home 3  9. Performing heavy activities around your home 3  10. Getting into/out of a car 3  11. Walking 2 blocks 3  12. Walking 1 mile 3  13. Going up/down 10 stairs (1 flight) 3  14. Standing for 1 hour 3  15.  sitting for 1 hour 3  16. Running on even ground 2  17. Running on uneven ground 2  18. Making sharp turns while running fast 2  19. Hopping  3  20. Rolling over in bed 4  Score total:  68/80     COGNITION: Overall cognitive status: Within functional limits for tasks assessed     SENSATION: WFL  POSTURE: No Significant postural limitations  PALPATION: Slight TTP to bilateral gastroc/soleus   LOWER EXTREMITY ROM:  Active ROM Right eval  Left eval  Hip flexion    Hip extension    Hip abduction    Hip adduction    Hip internal rotation    Hip external rotation    Knee flexion    Knee extension    Ankle dorsiflexion 10 12  Ankle plantarflexion    Ankle inversion    Ankle eversion     (Blank rows = not tested)  LOWER EXTREMITY MMT:  MMT Right eval Left eval  Hip flexion    Hip extension    Hip abduction    Hip adduction    Hip internal rotation    Hip external rotation    Knee flexion    Knee extension    Ankle dorsiflexion    Ankle plantarflexion    Ankle inversion    Ankle eversion     (Blank rows = not tested)  LOWER EXTREMITY SPECIAL TESTS:   DNT  FUNCTIONAL TESTS:  Functional squat - able with hands to floor  GAIT: Distance walked: 24ft Assistive device utilized: None Level of assistance: Complete Independence Comments: slight antalgic gait L   TREATMENT: [facility] Adult PT Treatment:                                                DATE: 01/29/24 Therapeutic Exercise: Rec Bike Level 2 x 5 minutes  Eccentric lowering 10 x 2 each  - 4in step Gastroc stretch slant board Soleus stretch slant board  SLS on foam with opp 3 way hip 10 x 2  STS x 10 - low chair  Seated Baps L3 CW CCW  each foot Seated toe scrunches on towel  Supine ankle inversion/eversion Green band x 20 each  Long sitting plantar fascia stretch with towel    Self Care: Tennis ball to perform self PF massage     [facility] Adult PT Treatment:                                                DATE: 01/22/24 Therapeutic Exercise: Rec Bike Level 2 x 5 minutes  Eccentric lowering 10 x 2 each  - 4in step Gastroc stretch slant board Soleus stretch slant board  SLS on oval 60 sec each  Towel scrunch x 60 Ankle inv/ev x 10 RTB Seated toe scrunches on towel  Supine ankle inversion/eversion Red band x 20 each  Long sitting plantar fascia stretch with towel  Manual Therapy: Plantar fascia STW   Self Care: Discussed rolling water bottle under foot    [facility] Adult PT Treatment:                                                DATE: 01/09/2024 Therapeutic Exercise: Towel scrunch x 60 Ankle inv/ev x 10 RTB Gastroc stretch x 30 L Soleus stretch x 30 L Eccentric lowering x 5 - 4in step  PATIENT EDUCATION:  Education details: eval findings, LEFS, HEP, POC Person educated: Patient Education method: Explanation, Demonstration, and Handouts Education comprehension: verbalized understanding and returned demonstration  HOME EXERCISE PROGRAM: Access Code: [email or web address] URL: [email or web address] Date: 01/09/2024 Prepared by: Alm  Stroup  Exercises -  Towel Scrunches  - 2 x daily - 7 x weekly - 3 sets - 1-2 min hold - Ankle Inversion with Resistance  - 2 x daily - 7 x weekly - 3 sets - 10 reps - red band hold - Ankle Eversion with Resistance  - 2 x daily - 7 x weekly - 3 sets - 10 reps - red band hold - Gastroc Stretch on Wall  - 2 x daily - 7 x weekly - 2-3 reps - 30 sec hold - Soleus Stretch on Wall  - 2 x daily - 7 x weekly - 3 sets - 2-3 reps - 30 sec hold - Eccentric Heel Lowering on Step  - 1 x daily - 7 x weekly - 2-3 sets - 10 reps  ASSESSMENT:  CLINICAL IMPRESSION: Pt reports compliance with HEP and sees improvement. 3/10 pain in plantar heels today. He is independent with HEP and reports and overall decrease in pain, especially the morning pain. Able to increase sets of eccentric SL heel raises and increase resistance band for ankle strengthening.  No increased pain reported at end of session.    EVAL: Patient is a 38 y.o. M who was seen today for physical therapy evaluation and treatment for chronic bilateral plantar fasciitis. Physical findings are consistent with DPM impression as pt demonstrates decrease in soleus length and palpable plantar fascia pain. LEFS shows decrease in functional ability below PLOF. Pt would benefit from skilled PT services working on foot intrinsic strength and calf length in order to decrease pain.   OBJECTIVE IMPAIRMENTS: Abnormal gait, decreased activity tolerance, decreased mobility, difficulty walking, decreased ROM, decreased strength, and pain  ACTIVITY LIMITATIONS: carrying, lifting, standing, squatting, stairs, transfers, and locomotion level  PARTICIPATION LIMITATIONS: driving, shopping, community activity, occupation, and yard work  PERSONAL FACTORS: Time since onset of injury/illness/exacerbation are also affecting patient's functional outcome.   REHAB POTENTIAL: Excellent  CLINICAL DECISION MAKING: Stable/uncomplicated  EVALUATION COMPLEXITY: Low   GOALS: Goals reviewed with  patient? No  SHORT TERM GOALS: Target date: 01/30/2024   Pt will be compliant and knowledgeable with initial HEP for improved comfort and carryover Baseline: initial HEP given  Goal status: MET   2.  Pt will self report bilateral foot pain no greater than 4/10 for improved comfort and functional ability Baseline: 7/10 at worst 01/29/24: improved  Goal status: ONGOING   LONG TERM GOALS: Target date: 03/05/2024   Pt will improve LEFS to no less than 78/80 as proxy for functional improvement with home ADLs and higher level community activity Baseline: 68/80 Goal status: INITIAL   2.  Pt will self report bilateral foot pain no greater than 1-2/10 for improved comfort and functional ability Baseline: 7/10 at worst Goal status: INITIAL   3.  Pt will improve bilateral ankle DF to no less than 15 degrees for improved calf length and decreased pain Baseline: see chart  Goal status: INITIAL  4.  Pt will be able to perform walking and desired recreational activities with no increase in foot pain for improved comfort and function Baseline: unable Goal status: INITIAL   PLAN:  PT FREQUENCY: 1x/week  PT DURATION: 8 weeks  PLANNED INTERVENTIONS: 97164- PT Re-evaluation, 97110-Therapeutic exercises, 97530- Therapeutic activity, 97112- Neuromuscular re-education, 97535- Self Care, 02859- Manual therapy, [id number]- Gait training, 97016- Vasopneumatic device, 79439 (1-2 muscles), 20561 (3+ muscles)- Dry Needling, Patient/Family education, Cryotherapy, and Moist heat  PLAN FOR NEXT SESSION: assess HEP response, calf stretching/eccentric strengthening, foot intrinsics  Harlene Persons, PTA 01/29/24 8:47 AM Phone: [phone] Fax: [phone]

## 2024-02-05 ENCOUNTER — Ambulatory Visit

## 2024-02-05 DIAGNOSIS — M79672 Pain in left foot: Secondary | ICD-10-CM

## 2024-02-05 DIAGNOSIS — R2689 Other abnormalities of gait and mobility: Secondary | ICD-10-CM

## 2024-02-05 DIAGNOSIS — M79671 Pain in right foot: Secondary | ICD-10-CM

## 2024-02-05 NOTE — Therapy (Signed)
 OUTPATIENT PHYSICAL THERAPY LOWER EXTREMITY TREATMENT    Patient Name: Shawn Diaz MRN: [id number] DOB:05/30/1985, 38 y.o., male Today's Date: 02/05/2024  END OF SESSION:  PT End of Session - 02/05/24 0803     Visit Number 4    Number of Visits 9    Date for Recertification  03/05/24    Authorization Type U[employer]    PT Start Time 0803    PT Stop Time 0841    PT Time Calculation (min) 38 min    Activity Tolerance Patient tolerated treatment well    Behavior During Therapy [facility] for tasks assessed/performed           Past Medical History:  Diagnosis Date   Anxiety    Past Surgical History:  Procedure Laterality Date   none     There are no active problems to display for this patient.   PCP: Cleotilde Planas, MD  REFERRING PROVIDER: Joya Stabs, DPM   REFERRING DIAG: M72.2 (ICD-10-CM) - Plantar fasciitis, bilateral   THERAPY DIAG:  Pain in left foot  Pain in right foot  Other abnormalities of gait and mobility  Rationale for Evaluation and Treatment: Rehabilitation  ONSET DATE: Chronic  SUBJECTIVE:   SUBJECTIVE STATEMENT: Pt presents to PT with reports of continued 3/10 bilateral foot pain but feels like he is making good progress.   EVAL: Pt presents to PT with reports of chronic bilateral plantar fascia pain for over one year. Notes pain is worst with first step in morning. Pt notes calf stretching does seem to help. Not limited in any activity but wants to be more comfort with activity.   PERTINENT HISTORY: See PMH  PAIN:  Are you having pain?  Yes: NPRS scale: 3/10 Worst: 7/10 Pain location: bilateral plantar fascia L>R Pain description: sharp, tight Aggravating factors: first step in morning Relieving factors: shoes, arch support  PRECAUTIONS: None  RED FLAGS: None   WEIGHT BEARING RESTRICTIONS: No  FALLS:  Has patient fallen in last 6 months? No  LIVING ENVIRONMENT: Lives with: lives with their family Lives in:  House/apartment  OCCUPATION: Works at [employer]   PLOF: Independent  PATIENT GOALS: decrease foot pain with walking, daily activities  NEXT MD VISIT: 02/21/2024  OBJECTIVE:  Note: Objective measures were completed at Evaluation unless otherwise noted.  DIAGNOSTIC FINDINGS: N/A  PATIENT SURVEYS:  LEFS  Extreme difficulty/unable (0), Quite a bit of difficulty (1), Moderate difficulty (2), Little difficulty (3), No difficulty (4) Survey date:  01/09/24  Any of your usual work, housework or school activities 3  2. Usual hobbies, recreational or sporting activities 3  3. Getting into/out of the bath 3  4. Walking between rooms 3  5. Putting on socks/shoes 3  6. Squatting  3  7. Lifting an object, like a bag of groceries from the floor 3  8. Performing light activities around your home 3  9. Performing heavy activities around your home 3  10. Getting into/out of a car 3  11. Walking 2 blocks 3  12. Walking 1 mile 3  13. Going up/down 10 stairs (1 flight) 3  14. Standing for 1 hour 3  15.  sitting for 1 hour 3  16. Running on even ground 2  17. Running on uneven ground 2  18. Making sharp turns while running fast 2  19. Hopping  3  20. Rolling over in bed 4  Score total:  68/80     COGNITION: Overall cognitive status: Within functional limits for  tasks assessed     SENSATION: WFL  POSTURE: No Significant postural limitations  PALPATION: Slight TTP to bilateral gastroc/soleus   LOWER EXTREMITY ROM:  Active ROM Right eval Left eval  Hip flexion    Hip extension    Hip abduction    Hip adduction    Hip internal rotation    Hip external rotation    Knee flexion    Knee extension    Ankle dorsiflexion 10 12  Ankle plantarflexion    Ankle inversion    Ankle eversion     (Blank rows = not tested)  LOWER EXTREMITY MMT:  MMT Right eval Left eval  Hip flexion    Hip extension    Hip abduction    Hip adduction    Hip internal rotation    Hip external  rotation    Knee flexion    Knee extension    Ankle dorsiflexion    Ankle plantarflexion    Ankle inversion    Ankle eversion     (Blank rows = not tested)  LOWER EXTREMITY SPECIAL TESTS:  DNT  FUNCTIONAL TESTS:  Functional squat - able with hands to floor  GAIT: Distance walked: 1ft Assistive device utilized: None Level of assistance: Complete Independence Comments: slight antalgic gait L   TREATMENT: [facility] Adult PT Treatment:                                                DATE: 02/05/24 Rec Bike Level 4 x 3 minutes Slant board gastroc/soleus stretch x 45 each Eccentric heel lowering 2x10  - 4in step Tandem on foam x 30 each SLS on foam 2x30 each Wobble board 2x45 fwd/bwd Ankle inv/ev 2x15 GTB each Manual Therapy: IASTM to bilateral plantar fascia   [facility] Adult PT Treatment:                                                DATE: 01/29/24 Therapeutic Exercise: Rec Bike Level 2 x 5 minutes  Eccentric lowering 10 x 2 each  - 4in step Gastroc stretch slant board Soleus stretch slant board  SLS on foam with opp 3 way hip 10 x 2  STS x 10 - low chair  Seated Baps L3 CW CCW  each foot Seated toe scrunches on towel  Supine ankle inversion/eversion Green band x 20 each  Long sitting plantar fascia stretch with towel  Self Care: Tennis ball to perform self PF massage     [facility] Adult PT Treatment:                                                DATE: 01/22/24 Therapeutic Exercise: Rec Bike Level 2 x 5 minutes  Eccentric lowering 10 x 2 each  - 4in step Gastroc stretch slant board Soleus stretch slant board  SLS on oval 60 sec each  Towel scrunch x 60 Ankle inv/ev x 10 RTB Seated toe scrunches on towel  Supine ankle inversion/eversion Red band x 20 each  Long sitting plantar fascia stretch with towel  Manual Therapy: Plantar fascia STW  Self  Care: Discussed rolling water bottle under foot    [facility] Adult PT Treatment:                                                 DATE: 01/09/2024 Therapeutic Exercise: Towel scrunch x 60 Ankle inv/ev x 10 RTB Gastroc stretch x 30 L Soleus stretch x 30 L Eccentric lowering x 5 - 4in step  PATIENT EDUCATION:  Education details: eval findings, LEFS, HEP, POC Person educated: Patient Education method: Explanation, Demonstration, and Handouts Education comprehension: verbalized understanding and returned demonstration  HOME EXERCISE PROGRAM: Access Code: [email or web address] URL: [email or web address] Date: 01/09/2024 Prepared by: Alm Kingdom  Exercises - Towel Scrunches  - 2 x daily - 7 x weekly - 3 sets - 1-2 min hold - Ankle Inversion with Resistance  - 2 x daily - 7 x weekly - 3 sets - 10 reps - red band hold - Ankle Eversion with Resistance  - 2 x daily - 7 x weekly - 3 sets - 10 reps - red band hold - Gastroc Stretch on Wall  - 2 x daily - 7 x weekly - 2-3 reps - 30 sec hold - Soleus Stretch on Wall  - 2 x daily - 7 x weekly - 3 sets - 2-3 reps - 30 sec hold - Eccentric Heel Lowering on Step  - 1 x daily - 7 x weekly - 2-3 sets - 10 reps  ASSESSMENT:  CLINICAL IMPRESSION: Pt was able to complete all prescribed exercises with no adverse effect. Exercises focused on improving calf length and foot intrinsic strength. Responded well to manual therapy interventions noting 0/10 pain post session. Pt progressing with therapy, will continue per POC.   EVAL: Patient is a 38 y.o. M who was seen today for physical therapy evaluation and treatment for chronic bilateral plantar fasciitis. Physical findings are consistent with DPM impression as pt demonstrates decrease in soleus length and palpable plantar fascia pain. LEFS shows decrease in functional ability below PLOF. Pt would benefit from skilled PT services working on foot intrinsic strength and calf length in order to decrease pain.   OBJECTIVE IMPAIRMENTS: Abnormal gait, decreased activity tolerance, decreased mobility, difficulty walking, decreased ROM,  decreased strength, and pain  ACTIVITY LIMITATIONS: carrying, lifting, standing, squatting, stairs, transfers, and locomotion level  PARTICIPATION LIMITATIONS: driving, shopping, community activity, occupation, and yard work  PERSONAL FACTORS: Time since onset of injury/illness/exacerbation are also affecting patient's functional outcome.   REHAB POTENTIAL: Excellent  CLINICAL DECISION MAKING: Stable/uncomplicated  EVALUATION COMPLEXITY: Low   GOALS: Goals reviewed with patient? No  SHORT TERM GOALS: Target date: 01/30/2024   Pt will be compliant and knowledgeable with initial HEP for improved comfort and carryover Baseline: initial HEP given  Goal status: MET   2.  Pt will self report bilateral foot pain no greater than 4/10 for improved comfort and functional ability Baseline: 7/10 at worst 01/29/24: improved  Goal status: ONGOING   LONG TERM GOALS: Target date: 03/05/2024   Pt will improve LEFS to no less than 78/80 as proxy for functional improvement with home ADLs and higher level community activity Baseline: 68/80 Goal status: INITIAL   2.  Pt will self report bilateral foot pain no greater than 1-2/10 for improved comfort and functional ability Baseline: 7/10 at worst Goal status: INITIAL   3.  Pt will improve bilateral  ankle DF to no less than 15 degrees for improved calf length and decreased pain Baseline: see chart  Goal status: INITIAL  4.  Pt will be able to perform walking and desired recreational activities with no increase in foot pain for improved comfort and function Baseline: unable Goal status: INITIAL   PLAN:  PT FREQUENCY: 1x/week  PT DURATION: 8 weeks  PLANNED INTERVENTIONS: 97164- PT Re-evaluation, 97110-Therapeutic exercises, 97530- Therapeutic activity, [id number]- Neuromuscular re-education, 97535- Self Care, [id number]- Manual therapy, [id number]- Gait training, 97016- Vasopneumatic device, 79439 (1-2 muscles), 20561 (3+ muscles)- Dry Needling,  Patient/Family education, Cryotherapy, and Moist heat  PLAN FOR NEXT SESSION: assess HEP response, calf stretching/eccentric strengthening, foot intrinsics   Alm JAYSON Kingdom PT  02/05/24 8:48 AM

## 2024-02-12 ENCOUNTER — Ambulatory Visit

## 2024-02-12 DIAGNOSIS — M79671 Pain in right foot: Secondary | ICD-10-CM

## 2024-02-12 DIAGNOSIS — M79672 Pain in left foot: Secondary | ICD-10-CM | POA: Diagnosis not present

## 2024-02-12 DIAGNOSIS — R2689 Other abnormalities of gait and mobility: Secondary | ICD-10-CM

## 2024-02-12 NOTE — Therapy (Signed)
 OUTPATIENT PHYSICAL THERAPY LOWER EXTREMITY TREATMENT    Patient Name: Shawn Diaz MRN: [id number] DOB:Oct 22, 1985, 38 y.o., male Today's Date: 02/12/2024  END OF SESSION:  PT End of Session - 02/12/24 0800     Visit Number 5    Number of Visits 9    Date for Recertification  03/05/24    Authorization Type UHC    PT Start Time 0800    Activity Tolerance Patient tolerated treatment well    Behavior During Therapy [facility] for tasks assessed/performed            Past Medical History:  Diagnosis Date   Anxiety    Past Surgical History:  Procedure Laterality Date   none     There are no active problems to display for this patient.   PCP: Cleotilde Planas, MD  REFERRING PROVIDER: Joya Stabs, DPM   REFERRING DIAG: M72.2 (ICD-10-CM) - Plantar fasciitis, bilateral   THERAPY DIAG:  Pain in left foot  Pain in right foot  Other abnormalities of gait and mobility  Rationale for Evaluation and Treatment: Rehabilitation  ONSET DATE: Chronic  SUBJECTIVE:   SUBJECTIVE STATEMENT: Pt presents to PT with continued 3/10 pain in bilateral foot/ankle. Has been compliant with HEP.   EVAL: Pt presents to PT with reports of chronic bilateral plantar fascia pain for over one year. Notes pain is worst with first step in morning. Pt notes calf stretching does seem to help. Not limited in any activity but wants to be more comfort with activity.   PERTINENT HISTORY: See PMH  PAIN:  Are you having pain?  Yes: NPRS scale: 3/10 Worst: 7/10 Pain location: bilateral plantar fascia L>R Pain description: sharp, tight Aggravating factors: first step in morning Relieving factors: shoes, arch support  PRECAUTIONS: None  RED FLAGS: None   WEIGHT BEARING RESTRICTIONS: No  FALLS:  Has patient fallen in last 6 months? No  LIVING ENVIRONMENT: Lives with: lives with their family Lives in: House/apartment  OCCUPATION: Works at [employer]   PLOF: Independent  PATIENT GOALS:  decrease foot pain with walking, daily activities  NEXT MD VISIT: 02/21/2024  OBJECTIVE:  Note: Objective measures were completed at Evaluation unless otherwise noted.  DIAGNOSTIC FINDINGS: N/A  PATIENT SURVEYS:  LEFS  Extreme difficulty/unable (0), Quite a bit of difficulty (1), Moderate difficulty (2), Little difficulty (3), No difficulty (4) Survey date:  01/09/24  Any of your usual work, housework or school activities 3  2. Usual hobbies, recreational or sporting activities 3  3. Getting into/out of the bath 3  4. Walking between rooms 3  5. Putting on socks/shoes 3  6. Squatting  3  7. Lifting an object, like a bag of groceries from the floor 3  8. Performing light activities around your home 3  9. Performing heavy activities around your home 3  10. Getting into/out of a car 3  11. Walking 2 blocks 3  12. Walking 1 mile 3  13. Going up/down 10 stairs (1 flight) 3  14. Standing for 1 hour 3  15.  sitting for 1 hour 3  16. Running on even ground 2  17. Running on uneven ground 2  18. Making sharp turns while running fast 2  19. Hopping  3  20. Rolling over in bed 4  Score total:  68/80     COGNITION: Overall cognitive status: Within functional limits for tasks assessed     SENSATION: WFL  POSTURE: No Significant postural limitations  PALPATION: Slight TTP to  bilateral gastroc/soleus   LOWER EXTREMITY ROM:  Active ROM Right eval Left eval  Hip flexion    Hip extension    Hip abduction    Hip adduction    Hip internal rotation    Hip external rotation    Knee flexion    Knee extension    Ankle dorsiflexion 10 12  Ankle plantarflexion    Ankle inversion    Ankle eversion     (Blank rows = not tested)  LOWER EXTREMITY MMT:  MMT Right eval Left eval  Hip flexion    Hip extension    Hip abduction    Hip adduction    Hip internal rotation    Hip external rotation    Knee flexion    Knee extension    Ankle dorsiflexion    Ankle plantarflexion     Ankle inversion    Ankle eversion     (Blank rows = not tested)  LOWER EXTREMITY SPECIAL TESTS:  DNT  FUNCTIONAL TESTS:  Functional squat - able with hands to floor  GAIT: Distance walked: 64ft Assistive device utilized: None Level of assistance: Complete Independence Comments: slight antalgic gait L   TREATMENT: [facility] Adult PT Treatment:                                                DATE: 02/12/24 Rec Bike Level 4 x 3 minutes Slant board gastroc/soleus stretch 2x45 each Eccentric heel lowering 2x15  - 4in step Standing heel raise with ball 2x15  SLS on foam 2x30 each Foam beam heel/toe lateral walk x 4 laps each Taping to reduce inversion  Manual Therapy: IASTM to bilateral plantar fascia  [facility] Adult PT Treatment:                                                DATE: 02/05/24 Rec Bike Level 4 x 3 minutes Slant board gastroc/soleus stretch x 45 each Eccentric heel lowering 2x10  - 4in step Tandem on foam x 30 each SLS on foam 2x30 each Wobble board 2x45 fwd/bwd Ankle inv/ev 2x15 GTB each Manual Therapy: IASTM to bilateral plantar fascia   [facility] Adult PT Treatment:                                                DATE: 01/29/24 Therapeutic Exercise: Rec Bike Level 2 x 5 minutes  Eccentric lowering 10 x 2 each  - 4in step Gastroc stretch slant board Soleus stretch slant board  SLS on foam with opp 3 way hip 10 x 2  STS x 10 - low chair  Seated Baps L3 CW CCW  each foot Seated toe scrunches on towel  Supine ankle inversion/eversion Green band x 20 each  Long sitting plantar fascia stretch with towel  Self Care: Tennis ball to perform self PF massage     [facility] Adult PT Treatment:  DATE: 01/22/24 Therapeutic Exercise: Rec Bike Level 2 x 5 minutes  Eccentric lowering 10 x 2 each  - 4in step Gastroc stretch slant board Soleus stretch slant board  SLS on oval 60 sec each  Towel scrunch x 60 Ankle inv/ev x 10  RTB Seated toe scrunches on towel  Supine ankle inversion/eversion Red band x 20 each  Long sitting plantar fascia stretch with towel  Manual Therapy: Plantar fascia STW  Self Care: Discussed rolling water bottle under foot    [facility] Adult PT Treatment:                                                DATE: 01/09/2024 Therapeutic Exercise: Towel scrunch x 60 Ankle inv/ev x 10 RTB Gastroc stretch x 30 L Soleus stretch x 30 L Eccentric lowering x 5 - 4in step  PATIENT EDUCATION:  Education details: eval findings, LEFS, HEP, POC Person educated: Patient Education method: Explanation, Demonstration, and Handouts Education comprehension: verbalized understanding and returned demonstration  HOME EXERCISE PROGRAM: Access Code: [email or web address] URL: [email or web address] Date: 02/12/2024 Prepared by: Alm Kingdom  Exercises - Towel Scrunches  - 2 x daily - 7 x weekly - 3 sets - 1-2 min hold - Ankle Inversion with Resistance  - 2 x daily - 7 x weekly - 3 sets - 10 reps - red band hold - Ankle Eversion with Resistance  - 2 x daily - 7 x weekly - 3 sets - 10 reps - red band hold - Gastroc Stretch on Wall  - 2 x daily - 7 x weekly - 2-3 reps - 30 sec hold - Soleus Stretch on Wall  - 2 x daily - 7 x weekly - 3 sets - 2-3 reps - 30 sec hold - Eccentric Heel Lowering on Step  - 1 x daily - 7 x weekly - 2-3 sets - 10 reps - Standing Calf Raise With Small Ball at Heels  - 1 x daily - 7 x weekly - 3 sets - 15 reps  ASSESSMENT:  CLINICAL IMPRESSION: Pt was able to complete all prescribed exercises with no adverse effect. Exercises focused on improving calf length and foot intrinsic strength. HEP updated for improving posterior tib strength. Discussed custom orthotics. Responded well to manual therapy interventions and taping noting 0/10 pain post session. Pt progressing with therapy, will continue per POC.  EVAL: Patient is a 38 y.o. M who was seen today for physical therapy evaluation  and treatment for chronic bilateral plantar fasciitis. Physical findings are consistent with DPM impression as pt demonstrates decrease in soleus length and palpable plantar fascia pain. LEFS shows decrease in functional ability below PLOF. Pt would benefit from skilled PT services working on foot intrinsic strength and calf length in order to decrease pain.   OBJECTIVE IMPAIRMENTS: Abnormal gait, decreased activity tolerance, decreased mobility, difficulty walking, decreased ROM, decreased strength, and pain  ACTIVITY LIMITATIONS: carrying, lifting, standing, squatting, stairs, transfers, and locomotion level  PARTICIPATION LIMITATIONS: driving, shopping, community activity, occupation, and yard work  PERSONAL FACTORS: Time since onset of injury/illness/exacerbation are also affecting patient's functional outcome.   REHAB POTENTIAL: Excellent  CLINICAL DECISION MAKING: Stable/uncomplicated  EVALUATION COMPLEXITY: Low   GOALS: Goals reviewed with patient? No  SHORT TERM GOALS: Target date: 01/30/2024   Pt will be compliant and knowledgeable with initial HEP for improved comfort and carryover  Baseline: initial HEP given  Goal status: MET   2.  Pt will self report bilateral foot pain no greater than 4/10 for improved comfort and functional ability Baseline: 7/10 at worst 01/29/24: improved  Goal status: ONGOING   LONG TERM GOALS: Target date: 03/05/2024   Pt will improve LEFS to no less than 78/80 as proxy for functional improvement with home ADLs and higher level community activity Baseline: 68/80 Goal status: INITIAL   2.  Pt will self report bilateral foot pain no greater than 1-2/10 for improved comfort and functional ability Baseline: 7/10 at worst Goal status: INITIAL   3.  Pt will improve bilateral ankle DF to no less than 15 degrees for improved calf length and decreased pain Baseline: see chart  Goal status: INITIAL  4.  Pt will be able to perform walking and desired  recreational activities with no increase in foot pain for improved comfort and function Baseline: unable Goal status: INITIAL   PLAN:  PT FREQUENCY: 1x/week  PT DURATION: 8 weeks  PLANNED INTERVENTIONS: 97164- PT Re-evaluation, 97110-Therapeutic exercises, 97530- Therapeutic activity, [id number]- Neuromuscular re-education, 97535- Self Care, 02859- Manual therapy, [id number]- Gait training, 97016- Vasopneumatic device, 79439 (1-2 muscles), 20561 (3+ muscles)- Dry Needling, Patient/Family education, Cryotherapy, and Moist heat  PLAN FOR NEXT SESSION: assess HEP response, calf stretching/eccentric strengthening, foot intrinsics   Alm BROCKS Orien Mayhall PT  02/12/24 8:00 AM

## 2024-02-21 ENCOUNTER — Encounter: Payer: Self-pay | Admitting: Podiatry

## 2024-02-21 ENCOUNTER — Ambulatory Visit: Admitting: Podiatry

## 2024-02-21 DIAGNOSIS — M722 Plantar fascial fibromatosis: Secondary | ICD-10-CM | POA: Diagnosis not present

## 2024-02-21 NOTE — Progress Notes (Signed)
  Subjective:  Patient ID: Shawn Diaz, male    DOB: 1985-12-30,   MRN: [id number]  Chief Complaint  Patient presents with   Plantar Fasciitis    Bilateral PF pain is 80% better than last visit.  Not diabetic .no anti coag    38 y.o. male presents for concern of continuing plantar fasciitis.  Relates doing about 80% better after this last visit.  Has been in physical therapy what causes release.  Has continued stretching exercises.. Denies any other pedal complaints. Denies n/v/f/c.   Past Medical History:  Diagnosis Date   Anxiety     Objective:  Physical Exam: Vascular: DP/PT pulses 2/4 bilateral. CFT <3 seconds. Normal hair growth on digits. No edema.  Skin. No lacerations or abrasions bilateral feet.  Musculoskeletal: MMT 5/5 bilateral lower extremities in DF, PF, Inversion and Eversion. Deceased ROM in DF of ankle joint.  Nontender to the medial calcaneal tubercle bilaterally . No pain with achilles, PT or arch. No pain with calcaneal squeeze. Some pain over the plantar fifth metatarsal on the left.  Neurological: Sensation intact to light touch.   Assessment:   1. Plantar fasciitis, bilateral        Plan:  Patient was evaluated and treated and all questions answered. Discussed plantar fasciitis with patient.  X-rays reviewed and discussed with patient. No acute fractures or dislocations noted. Mild spurring noted at inferior calcaneus.  Discussed treatment options including, ice, NSAIDS, supportive shoes, bracing, and stretching. Stretching exercises provided to be done on a daily basis.   Continue anti-inflammatories  Continue stretching Continue brace.  Follow-up as needed     Asberry Failing, DPM

## 2024-02-26 ENCOUNTER — Ambulatory Visit
